# Patient Record
Sex: Female | Born: 1990 | Race: White | Hispanic: Yes | Marital: Single | Smoking: Never smoker
Health system: Southern US, Community
[De-identification: ages and names within clinical notes are randomized; demographics above are authoritative.]

## PROBLEM LIST (undated history)

## (undated) HISTORY — PX: WISDOM TOOTH EXTRACTION: SHX21

---

## 2016-09-19 ENCOUNTER — Encounter: Payer: Self-pay | Admitting: Women's Health

## 2016-09-19 ENCOUNTER — Ambulatory Visit (INDEPENDENT_AMBULATORY_CARE_PROVIDER_SITE_OTHER): Payer: BC Managed Care – PPO | Admitting: Women's Health

## 2016-09-19 VITALS — BP 122/78 | Ht 61.0 in | Wt 123.0 lb

## 2016-09-19 DIAGNOSIS — Z01419 Encounter for gynecological examination (general) (routine) without abnormal findings: Secondary | ICD-10-CM

## 2016-09-19 DIAGNOSIS — Z23 Encounter for immunization: Secondary | ICD-10-CM | POA: Diagnosis not present

## 2016-09-19 DIAGNOSIS — N760 Acute vaginitis: Secondary | ICD-10-CM | POA: Diagnosis not present

## 2016-09-19 DIAGNOSIS — Z3041 Encounter for surveillance of contraceptive pills: Secondary | ICD-10-CM | POA: Diagnosis not present

## 2016-09-19 DIAGNOSIS — Z113 Encounter for screening for infections with a predominantly sexual mode of transmission: Secondary | ICD-10-CM | POA: Diagnosis not present

## 2016-09-19 DIAGNOSIS — B9689 Other specified bacterial agents as the cause of diseases classified elsewhere: Secondary | ICD-10-CM

## 2016-09-19 LAB — CBC WITH DIFFERENTIAL/PLATELET
BASOS ABS: 60 {cells}/uL (ref 0–200)
Basophils Relative: 1 %
EOS PCT: 1 %
Eosinophils Absolute: 60 cells/uL (ref 15–500)
HEMATOCRIT: 38.8 % (ref 35.0–45.0)
HEMOGLOBIN: 12.4 g/dL (ref 11.7–15.5)
LYMPHS ABS: 1620 {cells}/uL (ref 850–3900)
LYMPHS PCT: 27 %
MCH: 30.3 pg (ref 27.0–33.0)
MCHC: 32 g/dL (ref 32.0–36.0)
MCV: 94.9 fL (ref 80.0–100.0)
MONO ABS: 420 {cells}/uL (ref 200–950)
MPV: 9 fL (ref 7.5–12.5)
Monocytes Relative: 7 %
NEUTROS PCT: 64 %
Neutro Abs: 3840 cells/uL (ref 1500–7800)
Platelets: 270 10*3/uL (ref 140–400)
RBC: 4.09 MIL/uL (ref 3.80–5.10)
RDW: 14.1 % (ref 11.0–15.0)
WBC: 6 10*3/uL (ref 3.8–10.8)

## 2016-09-19 LAB — WET PREP FOR TRICH, YEAST, CLUE
Trich, Wet Prep: NONE SEEN
Yeast Wet Prep HPF POC: NONE SEEN

## 2016-09-19 MED ORDER — METRONIDAZOLE 500 MG PO TABS: 500.0000 mg | ORAL_TABLET | Freq: Two times a day (BID) | ORAL | 0 refills | Status: DC

## 2016-09-19 MED ORDER — NORETHIN ACE-ETH ESTRAD-FE 1-20 MG-MCG(24) PO TABS: ORAL_TABLET | ORAL | 4 refills | Status: DC

## 2016-09-19 NOTE — Progress Notes (Signed)
Lourdes SledgeKatherine Griffy Feb 01, 1991 [id number]    History:   26 y/o Hispanic/Caucasian female presents for annual exam (new patient). She reports regular cycles lasting 5 days with mild premenstral syndrome prior to start of menses, currently on Loestrin. She denies dysmenorrhea and menorrhagia. Sexually active with 1 new partner . Reports last pap normal greater than 4 years ago. Has completed 2 of 3 shots for Gardisil.       Past medical history, past surgical history, family history and social history were all reviewed and documented in the EPIC chart. 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  ROS:  A ROS was performed and pertinent positives and negatives are included.  Exam:  Vitals:   09/19/16 1606  BP: 122/78  Weight: 123 lb (55.8 kg)  Height: 5\' 1"  (1.549 m)   Body mass index is 23.24 kg/m.   General appearance:  Normal Thyroid:  Symmetrical, normal in size, without palpable masses or nodularity. Respiratory  Auscultation:  Clear without wheezing or rhonchi Cardiovascular  Auscultation:  Regular rate, without rubs, murmurs or gallops  Edema/varicosities:  Not grossly evident Abdominal  Soft,nontender, without masses, guarding or rebound.  Liver/spleen:  No organomegaly noted  Hernia:  None appreciated  Skin  Inspection:  Grossly normal   Breasts: Examined lying and sitting.     Right: Without masses, retractions, discharge or axillary adenopathy.     Left: Without masses, retractions, discharge or axillary adenopathy. Gentitourinary   Inguinal/mons:  Normal without inguinal adenopathy  External genitalia:  Normal  BUS/Urethra/Skene's glands:  Normal  Vagina:  Normal  Cervix:  Normal  Uterus:  normal in size, shape and contour.  Midline and mobile  Adnexa/parametria:     Rt: Without masses or tenderness.   Lt: Without masses or tenderness.  Anus and perineum: Normal   Assessment/Plan:  26 y.o. SHF G0 new patient annual exam with no complaints.  Monthly  cycle on Loestrin Bacterial vaginosis STD screen  Plan: Loestrin 1/20 prescription, proper use, slight risk for blood clots and strokes reviewed. Condoms encouraged until permanent partner. SBE's, exercise, calcium rich diet, MVI daily encouraged. Third Gardasil given reviewed completed series. Flagyl 500 twice daily for 7 days, alcohol precautions reviewed. CBC, GC/Chlamydia, HIV, hep B, C, RPR. Pap.   Harrington Challengerancy J Young [facility]NP, 5:03 PM 09/19/2016

## 2016-09-19 NOTE — Patient Instructions (Signed)
Bacterial Vaginosis Bacterial vaginosis is an infection of the vagina. It happens when too many germs (bacteria) grow in the vagina. This infection puts you at risk for infections from sex (STIs). Treating this infection can lower your risk for some STIs. You should also treat this if you are pregnant. It can cause your baby to be born early. Follow these instructions at home: Medicines  Take over-the-counter and prescription medicines only as told by your doctor.  Take or use your antibiotic medicine as told by your doctor. Do not stop taking or using it even if you start to feel better. General instructions  If you your sexual partner is a woman, tell her that you have this infection. She needs to get treatment if she has symptoms. If you have a female partner, he does not need to be treated.  During treatment: ? Avoid sex. ? Do not douche. ? Avoid alcohol as told. ? Avoid breastfeeding as told.  Drink enough fluid to keep your pee (urine) clear or pale yellow.  Keep your vagina and butt (rectum) clean. ? Wash the area with warm water every day. ? Wipe from front to back after you use the toilet.  Keep all follow-up visits as told by your doctor. This is important. Preventing this condition  Do not douche.  Use only warm water to wash around your vagina.  Use protection when you have sex. This includes: ? Latex condoms. ? Dental dams.  Limit how many people you have sex with. It is best to only have sex with the same person (be monogamous).  Get tested for STIs. Have your partner get tested.  Wear underwear that is cotton or lined with cotton.  Avoid tight pants and pantyhose. This is most important in summer.  Do not use any products that have nicotine or tobacco in them. These include cigarettes and e-cigarettes. If you need help quitting, ask your doctor.  Do not use illegal drugs.  Limit how much alcohol you drink. Contact a doctor if:  Your symptoms do not get  better, even after you are treated.  You have more discharge or pain when you pee (urinate).  You have a fever.  You have pain in your belly (abdomen).  You have pain with sex.  Your bleed from your vagina between periods. Summary  This infection happens when too many germs (bacteria) grow in the vagina.  Treating this condition can lower your risk for some infections from sex (STIs).  You should also treat this if you are pregnant. It can cause early (premature) birth.  Do not stop taking or using your antibiotic medicine even if you start to feel better. This information is not intended to replace advice given to you by your health care provider. Make sure you discuss any questions you have with your health care provider. Document Released: 01/11/2008 Document Revised: 12/18/2015 Document Reviewed: 12/18/2015 Elsevier Interactive Patient Education  2017 [place] Maintenance, Female Adopting a healthy lifestyle and getting preventive care can go a long way to promote health and wellness. Talk with your health care provider about what schedule of regular examinations is right for you. This is a good chance for you to check in with your provider about disease prevention and staying healthy. In between checkups, there are plenty of things you can do on your own. Experts have done a lot of research about which lifestyle changes and preventive measures are most likely to keep you healthy. Ask your health care  provider for more information. Weight and diet Eat a healthy diet  Be sure to include plenty of vegetables, fruits, low-fat dairy products, and lean protein.  Do not eat a lot of foods high in solid fats, added sugars, or salt.  Get regular exercise. This is one of the most important things you can do for your health. ? Most adults should exercise for at least 150 minutes each week. The exercise should increase your heart rate and make you sweat (moderate-intensity  exercise). ? Most adults should also do strengthening exercises at least twice a week. This is in addition to the moderate-intensity exercise.  Maintain a healthy weight  Body mass index (BMI) is a measurement that can be used to identify possible weight problems. It estimates body fat based on height and weight. Your health care provider can help determine your BMI and help you achieve or maintain a healthy weight.  For females 92 years of age and older: ? A BMI below 18.5 is considered underweight. ? A BMI of 18.5 to 24.9 is normal. ? A BMI of 25 to 29.9 is considered overweight. ? A BMI of 30 and above is considered obese.  Watch levels of cholesterol and blood lipids  You should start having your blood tested for lipids and cholesterol at 25 years of age, then have this test every 5 years.  You may need to have your cholesterol levels checked more often if: ? Your lipid or cholesterol levels are high. ? You are older than 25 years of age. ? You are at high risk for heart disease.  Cancer screening Lung Cancer  Lung cancer screening is recommended for adults 78-69 years old who are at high risk for lung cancer because of a history of smoking.  A yearly low-dose CT scan of the lungs is recommended for people who: ? Currently smoke. ? Have quit within the past 15 years. ? Have at least a 30-pack-year history of smoking. A pack year is smoking an average of one pack of cigarettes a day for 1 year.  Yearly screening should continue until it has been 15 years since you quit.  Yearly screening should stop if you develop a health problem that would prevent you from having lung cancer treatment.  Breast Cancer  Practice breast self-awareness. This means understanding how your breasts normally appear and feel.  It also means doing regular breast self-exams. Let your health care provider know about any changes, no matter how small.  If you are in your 20s or 30s, you should have a  clinical breast exam (CBE) by a health care provider every 1-3 years as part of a regular health exam.  If you are 21 or older, have a CBE every year. Also consider having a breast X-ray (mammogram) every year.  If you have a family history of breast cancer, talk to your health care provider about genetic screening.  If you are at high risk for breast cancer, talk to your health care provider about having an MRI and a mammogram every year.  Breast cancer gene (BRCA) assessment is recommended for women who have family members with BRCA-related cancers. BRCA-related cancers include: ? Breast. ? Ovarian. ? Tubal. ? Peritoneal cancers.  Results of the assessment will determine the need for genetic counseling and BRCA1 and BRCA2 testing.  Cervical Cancer Your health care provider may recommend that you be screened regularly for cancer of the pelvic organs (ovaries, uterus, and vagina). This screening involves a pelvic examination,  including checking for microscopic changes to the surface of your cervix (Pap test). You may be encouraged to have this screening done every 3 years, beginning at age 21.  For women ages 30-65, health care providers may recommend pelvic exams and Pap testing every 3 years, or they may recommend the Pap and pelvic exam, combined with testing for human papilloma virus (HPV), every 5 years. Some types of HPV increase your risk of cervical cancer. Testing for HPV may also be done on women of any age with unclear Pap test results.  Other health care providers may not recommend any screening for nonpregnant women who are considered low risk for pelvic cancer and who do not have symptoms. Ask your health care provider if a screening pelvic exam is right for you.  If you have had past treatment for cervical cancer or a condition that could lead to cancer, you need Pap tests and screening for cancer for at least 20 years after your treatment. If Pap tests have been discontinued,  your risk factors (such as having a new sexual partner) need to be reassessed to determine if screening should resume. Some women have medical problems that increase the chance of getting cervical cancer. In these cases, your health care provider may recommend more frequent screening and Pap tests.  Colorectal Cancer  This type of cancer can be detected and often prevented.  Routine colorectal cancer screening usually begins at 25 years of age and continues through 25 years of age.  Your health care provider may recommend screening at an earlier age if you have risk factors for colon cancer.  Your health care provider may also recommend using home test kits to check for hidden blood in the stool.  A small camera at the end of a tube can be used to examine your colon directly (sigmoidoscopy or colonoscopy). This is done to check for the earliest forms of colorectal cancer.  Routine screening usually begins at age 50.  Direct examination of the colon should be repeated every 5-10 years through 25 years of age. However, you may need to be screened more often if early forms of precancerous polyps or small growths are found.  Skin Cancer  Check your skin from head to toe regularly.  Tell your health care provider about any new moles or changes in moles, especially if there is a change in a mole's shape or color.  Also tell your health care provider if you have a mole that is larger than the size of a pencil eraser.  Always use sunscreen. Apply sunscreen liberally and repeatedly throughout the day.  Protect yourself by wearing long sleeves, pants, a wide-brimmed hat, and sunglasses whenever you are outside.  Heart disease, diabetes, and high blood pressure  High blood pressure causes heart disease and increases the risk of stroke. High blood pressure is more likely to develop in: ? People who have blood pressure in the high end of the normal range (130-139/85-89 mm Hg). ? People who are  overweight or obese. ? People who are African American.  If you are 18-39 years of age, have your blood pressure checked every 3-5 years. If you are 40 years of age or older, have your blood pressure checked every year. You should have your blood pressure measured twice-once when you are at a hospital or clinic, and once when you are not at a hospital or clinic. Record the average of the two measurements. To check your blood pressure when you are not   at a hospital or clinic, you can use: ? An automated blood pressure machine at a pharmacy. ? A home blood pressure monitor.  If you are between 59 years and 66 years old, ask your health care provider if you should take aspirin to prevent strokes.  Have regular diabetes screenings. This involves taking a blood sample to check your fasting blood sugar level. ? If you are at a normal weight and have a low risk for diabetes, have this test once every three years after 26 years of age. ? If you are overweight and have a high risk for diabetes, consider being tested at a younger age or more often. Preventing infection Hepatitis B  If you have a higher risk for hepatitis B, you should be screened for this virus. You are considered at high risk for hepatitis B if: ? You were born in a country where hepatitis B is common. Ask your health care provider which countries are considered high risk. ? Your parents were born in a high-risk country, and you have not been immunized against hepatitis B (hepatitis B vaccine). ? You have HIV or AIDS. ? You use needles to inject street drugs. ? You live with someone who has hepatitis B. ? You have had sex with someone who has hepatitis B. ? You get hemodialysis treatment. ? You take certain medicines for conditions, including cancer, organ transplantation, and autoimmune conditions.  Hepatitis C  Blood testing is recommended for: ? Everyone born from [birth year] through 1964. ? Anyone with known risk factors for  hepatitis C.  Sexually transmitted infections (STIs)  You should be screened for sexually transmitted infections (STIs) including gonorrhea and chlamydia if: ? You are sexually active and are younger than 25 years of age. ? You are older than 25 years of age and your health care provider tells you that you are at risk for this type of infection. ? Your sexual activity has changed since you were last screened and you are at an increased risk for chlamydia or gonorrhea. Ask your health care provider if you are at risk.  If you do not have HIV, but are at risk, it may be recommended that you take a prescription medicine daily to prevent HIV infection. This is called pre-exposure prophylaxis (PrEP). You are considered at risk if: ? You are sexually active and do not regularly use condoms or know the HIV status of your partner(s). ? You take drugs by injection. ? You are sexually active with a partner who has HIV.  Talk with your health care provider about whether you are at high risk of being infected with HIV. If you choose to begin PrEP, you should first be tested for HIV. You should then be tested every 3 months for as long as you are taking PrEP. Pregnancy  If you are premenopausal and you may become pregnant, ask your health care provider about preconception counseling.  If you may become pregnant, take 400 to 800 micrograms (mcg) of folic acid every day.  If you want to prevent pregnancy, talk to your health care provider about birth control (contraception). Osteoporosis and menopause  Osteoporosis is a disease in which the bones lose minerals and strength with aging. This can result in serious bone fractures. Your risk for osteoporosis can be identified using a bone density scan.  If you are 57 years of age or older, or if you are at risk for osteoporosis and fractures, ask your health care provider if you should be  screened.  Ask your health care provider whether you should take a  calcium or vitamin D supplement to lower your risk for osteoporosis.  Menopause may have certain physical symptoms and risks.  Hormone replacement therapy may reduce some of these symptoms and risks. Talk to your health care provider about whether hormone replacement therapy is right for you. Follow these instructions at home:  Schedule regular health, dental, and eye exams.  Stay current with your immunizations.  Do not use any tobacco products including cigarettes, chewing tobacco, or electronic cigarettes.  If you are pregnant, do not drink alcohol.  If you are breastfeeding, limit how much and how often you drink alcohol.  Limit alcohol intake to no more than 1 drink per day for nonpregnant women. One drink equals 12 ounces of beer, 5 ounces of wine, or 1 ounces of hard liquor.  Do not use street drugs.  Do not share needles.  Ask your health care provider for help if you need support or information about quitting drugs.  Tell your health care provider if you often feel depressed.  Tell your health care provider if you have ever been abused or do not feel safe at home. This information is not intended to replace advice given to you by your health care provider. Make sure you discuss any questions you have with your health care provider. Document Released: 10/17/2010 Document Revised: 09/09/2015 Document Reviewed: 01/05/2015 Elsevier Interactive Patient Education  2018 [employer]  

## 2016-09-20 LAB — URINALYSIS W MICROSCOPIC + REFLEX CULTURE
Bacteria, UA: NONE SEEN [HPF]
Bilirubin Urine: NEGATIVE
CASTS: NONE SEEN [LPF]
Crystals: NONE SEEN [HPF]
Glucose, UA: NEGATIVE
Hgb urine dipstick: NEGATIVE
KETONES UR: NEGATIVE
Leukocytes, UA: NEGATIVE
NITRITE: NEGATIVE
PH: 6 (ref 5.0–8.0)
Protein, ur: NEGATIVE
RBC / HPF: NONE SEEN RBC/HPF (ref <=2)
SPECIFIC GRAVITY, URINE: 1.013 (ref 1.001–1.035)
Yeast: NONE SEEN [HPF]

## 2016-09-20 LAB — HEPATITIS C ANTIBODY: HCV AB: NEGATIVE

## 2016-09-20 LAB — GC/CHLAMYDIA PROBE AMP
CT Probe RNA: NOT DETECTED
GC PROBE AMP APTIMA: NOT DETECTED

## 2016-09-20 LAB — RPR

## 2016-09-20 LAB — HEPATITIS B SURFACE ANTIGEN: HEP B S AG: NEGATIVE

## 2016-09-20 LAB — HIV ANTIBODY (ROUTINE TESTING W REFLEX): HIV 1&2 Ab, 4th Generation: NONREACTIVE

## 2016-09-20 NOTE — Addendum Note (Signed)
Addended by: Kem ParkinsonBARNES, Annalia Metzger on: 09/20/2016 08:26 AM   Modules accepted: Orders

## 2016-09-21 LAB — PAP IG W/ RFLX HPV ASCU

## 2016-09-21 LAB — URINE CULTURE

## 2016-09-22 LAB — HUMAN PAPILLOMAVIRUS, HIGH RISK: HPV DNA High Risk: NOT DETECTED

## 2016-12-27 ENCOUNTER — Ambulatory Visit
Admission: RE | Admit: 2016-12-27 | Discharge: 2016-12-27 | Disposition: A | Discharge status: Home / Self Care | Payer: BC Managed Care – PPO | Source: Ambulatory Visit | Attending: Family Medicine | Admitting: Family Medicine

## 2016-12-27 ENCOUNTER — Other Ambulatory Visit: Payer: Self-pay | Admitting: Family Medicine

## 2016-12-27 DIAGNOSIS — Q799 Congenital malformation of musculoskeletal system, unspecified: Secondary | ICD-10-CM

## 2017-09-24 ENCOUNTER — Ambulatory Visit: Payer: BC Managed Care – PPO | Admitting: Women's Health

## 2017-09-24 ENCOUNTER — Encounter: Payer: Self-pay | Admitting: Women's Health

## 2017-09-24 VITALS — BP 110/78 | Ht 61.0 in | Wt 126.0 lb

## 2017-09-24 DIAGNOSIS — Z3041 Encounter for surveillance of contraceptive pills: Secondary | ICD-10-CM | POA: Diagnosis not present

## 2017-09-24 DIAGNOSIS — Z01419 Encounter for gynecological examination (general) (routine) without abnormal findings: Secondary | ICD-10-CM

## 2017-09-24 LAB — CBC WITH DIFFERENTIAL/PLATELET
BASOS PCT: 0.4 %
Basophils Absolute: 27 cells/uL (ref 0–200)
EOS ABS: 41 {cells}/uL (ref 15–500)
EOS PCT: 0.6 %
HCT: 35.3 % (ref 35.0–45.0)
Hemoglobin: 11.9 g/dL (ref 11.7–15.5)
Lymphs Abs: 1884 cells/uL (ref 850–3900)
MCH: 31.6 pg (ref 27.0–33.0)
MCHC: 33.7 g/dL (ref 32.0–36.0)
MCV: 93.6 fL (ref 80.0–100.0)
MONOS PCT: 6.7 %
MPV: 9.2 fL (ref 7.5–12.5)
NEUTROS ABS: 4393 {cells}/uL (ref 1500–7800)
Neutrophils Relative %: 64.6 %
PLATELETS: 234 10*3/uL (ref 140–400)
RBC: 3.77 10*6/uL — ABNORMAL LOW (ref 3.80–5.10)
RDW: 13 % (ref 11.0–15.0)
TOTAL LYMPHOCYTE: 27.7 %
WBC mixed population: 456 cells/uL (ref 200–950)
WBC: 6.8 10*3/uL (ref 3.8–10.8)

## 2017-09-24 MED ORDER — NORETHIN ACE-ETH ESTRAD-FE 1-20 MG-MCG(24) PO TABS: ORAL_TABLET | ORAL | 4 refills | Status: DC

## 2017-09-24 NOTE — Progress Notes (Signed)
Alicia Koch 1990-11-17 [id number]    History:    Presents for annual exam.  Regular monthly cycle on Loestrin without complaint.  Same partner.  Normal Pap history.  Gardasil series completed.  Past medical history, past surgical history, family history and social history were all reviewed and documented in the EPIC chart.  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  Mother questionable history of polycystic ovary disease.  Father deceased from an accident at age 26.  ROS:  A ROS was performed and pertinent positives and negatives are included.  Exam:  Vitals:   09/24/17 1601  BP: 110/78  Weight: 126 lb (57.2 kg)  Height: 5\' 1"  (1.549 m)   Body mass index is 23.81 kg/m.   General appearance:  Normal Thyroid:  Symmetrical, normal in size, without palpable masses or nodularity. Respiratory  Auscultation:  Clear without wheezing or rhonchi Cardiovascular  Auscultation:  Regular rate, without rubs, murmurs or gallops  Edema/varicosities:  Not grossly evident Abdominal  Soft,nontender, without masses, guarding or rebound.  Liver/spleen:  No organomegaly noted  Hernia:  None appreciated  Skin  Inspection:  Grossly normal   Breasts: Examined lying and sitting.     Right: Without masses, retractions, discharge or axillary adenopathy.     Left: Without masses, retractions, discharge or axillary adenopathy. Gentitourinary   Inguinal/mons:  Normal without inguinal adenopathy  External genitalia:  Normal  BUS/Urethra/Skene's glands:  Normal  Vagina:  Normal  Cervix:  Normal  Uterus:   normal in size, shape and contour.  Midline and mobile  Adnexa/parametria:     Rt: Without masses or tenderness.   Lt: Without masses or tenderness.  Anus and perineum: Normal  Digital rectal exam: Normal sphincter tone without palpated masses or tenderness  Assessment/Plan:  27 y.o. SHF G0 for annual exam with no complaints.  Monthly cycle on Loestrin  Plan:  Loestrin 1/20 prescription, proper use, slight risk for blood clots and strokes reviewed.  SBE's, exercise, calcium rich foods, MVI daily encouraged.  CBC.  Pap normal 2018, new screening guidelines reviewed.   Harrington Challengerancy J Charlyn Vialpando [facility], 6:23 PM 09/24/2017

## 2017-09-24 NOTE — Patient Instructions (Signed)

## 2018-07-29 IMAGING — DX DG RIBS W/ CHEST 3+V BILAT
5 series · 5 of 5 positions shown · non-contrast
Comparison: None.

CLINICAL DATA: Bony abnormality.

EXAM:
BILATERAL RIBS AND CHEST - 4+ VIEW

[dg ribs bilateral w/chest (1 of 5)]
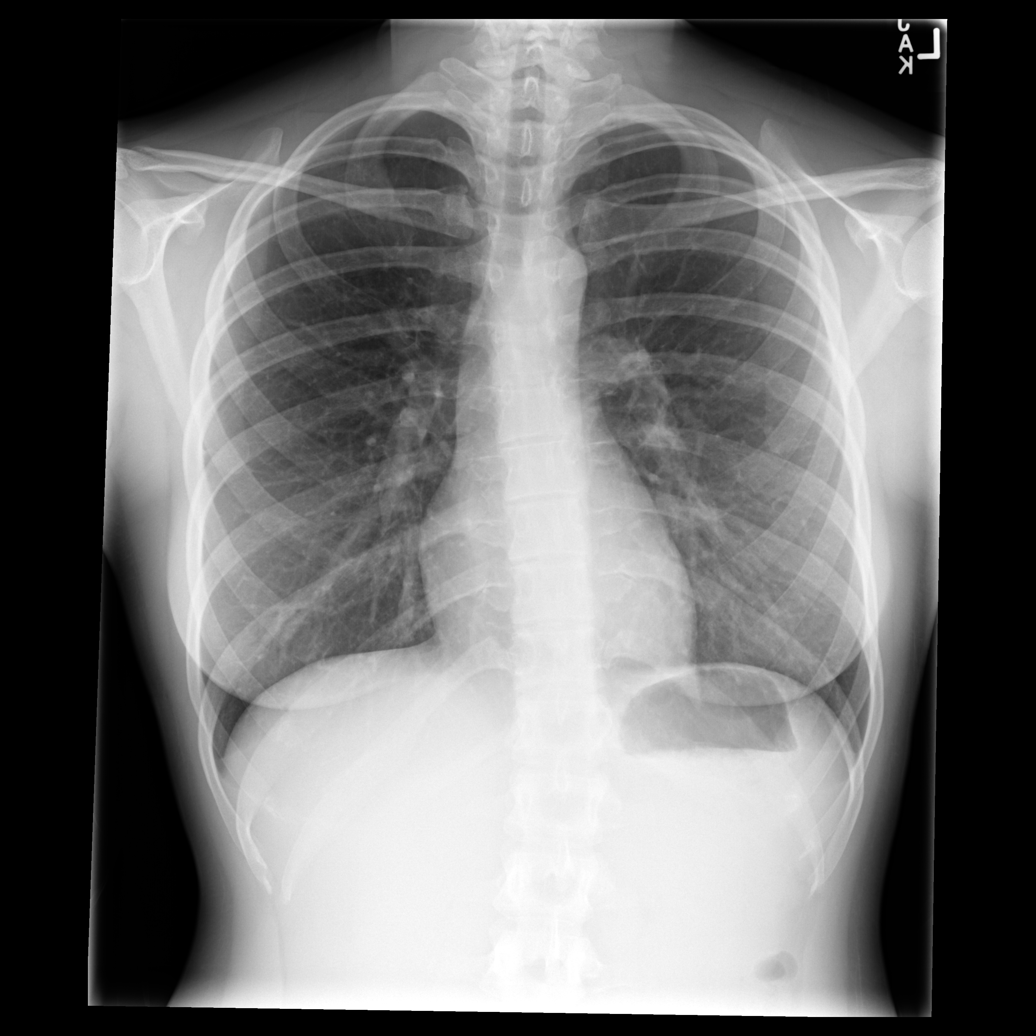

[dg ribs bilateral w/chest (2 of 5)]
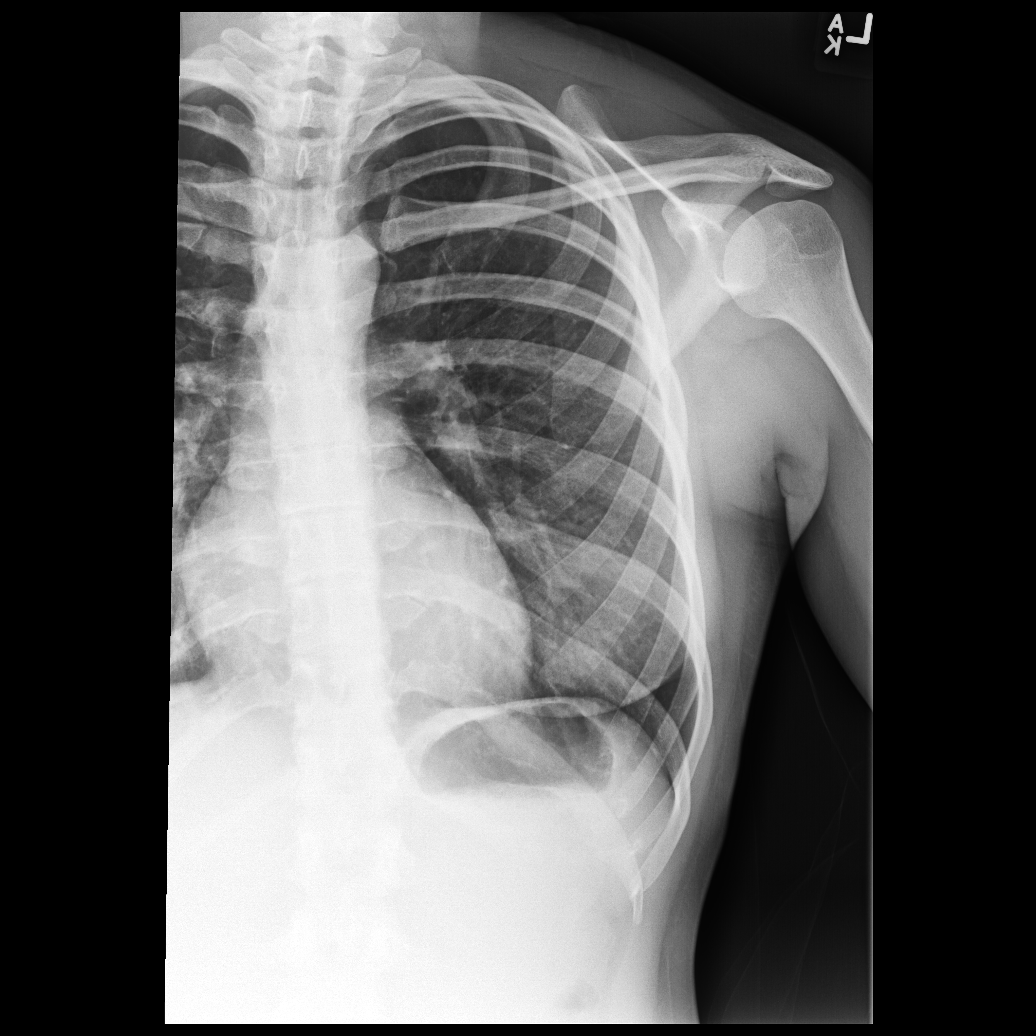

[dg ribs bilateral w/chest (3 of 5)]
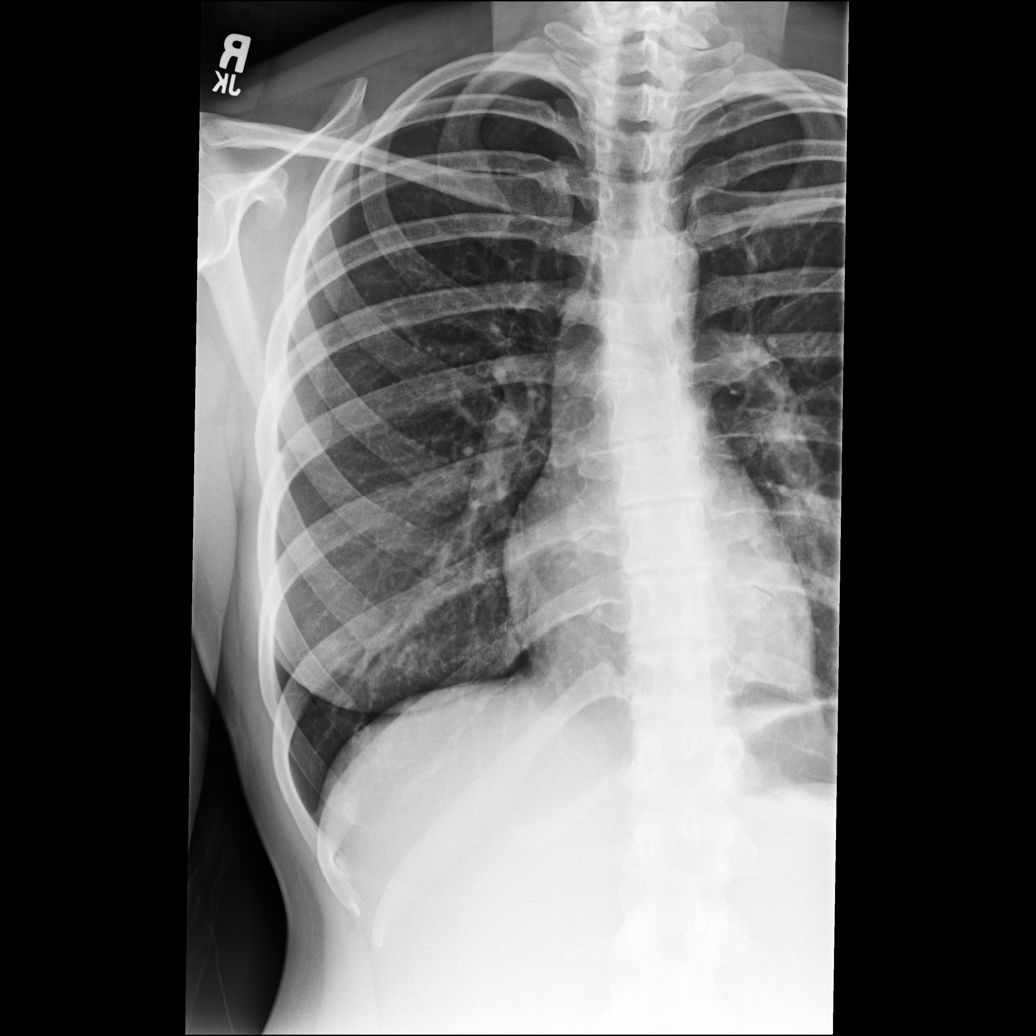

[dg ribs bilateral w/chest (4 of 5)]
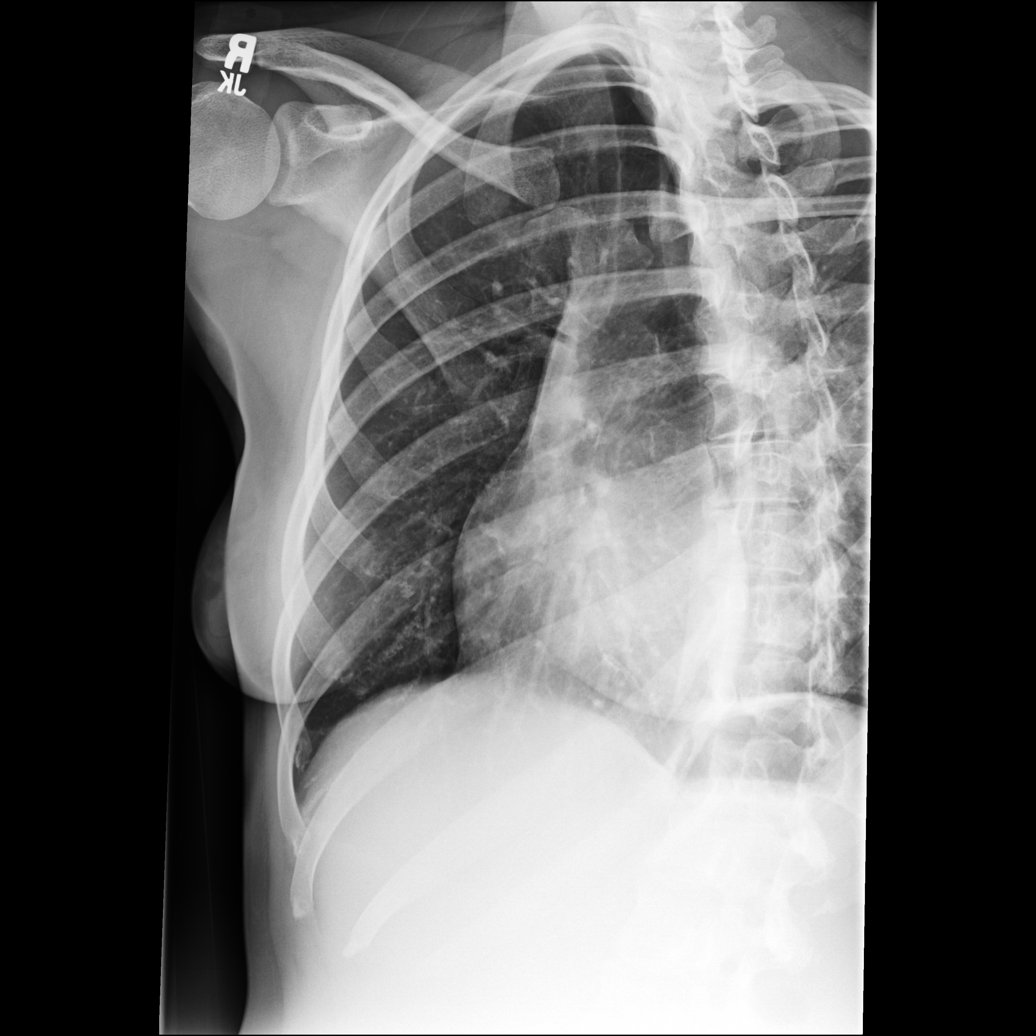

[dg ribs bilateral w/chest (5 of 5)]
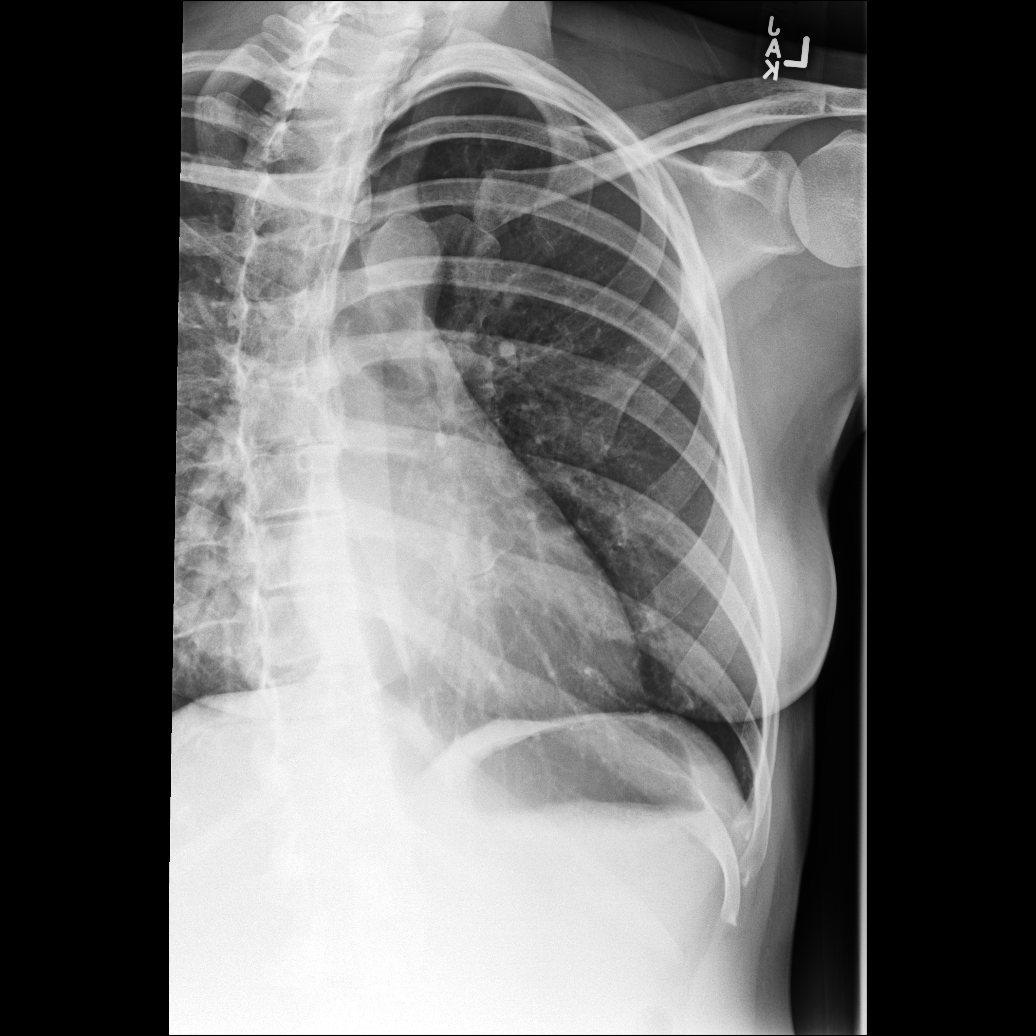

[5 of 5 positions shown; findings below may reference images not displayed]

FINDINGS: No fracture or other bone lesions are seen involving the ribs. There
is no evidence of pneumothorax or pleural effusion. Both lungs are
clear. Heart size and mediastinal contours are within normal limits.
IMPRESSION: Normal bilateral ribs.  No acute cardiopulmonary abnormality seen.

## 2018-09-30 ENCOUNTER — Encounter: Payer: Self-pay | Admitting: Women's Health

## 2018-09-30 ENCOUNTER — Other Ambulatory Visit: Payer: Self-pay

## 2018-09-30 ENCOUNTER — Ambulatory Visit: Payer: BC Managed Care – PPO | Admitting: Women's Health

## 2018-09-30 VITALS — BP 114/70 | Ht 61.0 in | Wt 133.0 lb

## 2018-09-30 DIAGNOSIS — Z3041 Encounter for surveillance of contraceptive pills: Secondary | ICD-10-CM | POA: Diagnosis not present

## 2018-09-30 DIAGNOSIS — Z01419 Encounter for gynecological examination (general) (routine) without abnormal findings: Secondary | ICD-10-CM

## 2018-09-30 MED ORDER — NORETHIN ACE-ETH ESTRAD-FE 1-20 MG-MCG(24) PO TABS: ORAL_TABLET | ORAL | 4 refills | Status: AC

## 2018-09-30 NOTE — Patient Instructions (Signed)

## 2018-09-30 NOTE — Progress Notes (Signed)
Alicia Koch 1990/05/24 [id number]    History:    Presents for annual exam.  Monthly light cycle on Loestrin without complaint.  Same partner.  Normal Pap history.  Gardasil series completed.  Past medical history, past surgical history, family history and social history were all reviewed and documented in the EPIC chart.  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  Father died from an accident years ago.  ROS:  A ROS was performed and pertinent positives and negatives are included.  Exam:  Vitals:   09/30/18 1608  BP: 114/70  Weight: 133 lb (60.3 kg)  Height: 5\' 1"  (1.549 m)   Body mass index is 25.13 kg/m.   General appearance:  Normal Thyroid:  Symmetrical, normal in size, without palpable masses or nodularity. Respiratory  Auscultation:  Clear without wheezing or rhonchi Cardiovascular  Auscultation:  Regular rate, without rubs, murmurs or gallops  Edema/varicosities:  Not grossly evident Abdominal  Soft,nontender, without masses, guarding or rebound.  Liver/spleen:  No organomegaly noted  Hernia:  None appreciated  Skin  Inspection:  Grossly normal   Breasts: Examined lying and sitting.     Right: Without masses, retractions, discharge or axillary adenopathy.     Left: Without masses, retractions, discharge or axillary adenopathy. Gentitourinary   Inguinal/mons:  Normal without inguinal adenopathy  External genitalia:  Normal  BUS/Urethra/Skene's glands:  Normal  Vagina:  Normal  Cervix:  Normal  Uterus:   normal in size, shape and contour.  Midline and mobile  Adnexa/parametria:     Rt: Without masses or tenderness.   Lt: Without masses or tenderness.  Anus and perineum: Normal    Assessment/Plan:  28 y.o. S WF G0 for annual exam with no complaints.  Monthly cycle on Loestrin  Plan: Loestrin 1/20 prescription, proper use, slight risk for blood clots and strokes reviewed.  SBEs, regular exercise,  calcium rich foods, MVI daily encouraged.  CBC, Pap normal 2018, new screening guidelines reviewed.    [place], 5:15 PM 09/30/2018

## 2019-10-01 ENCOUNTER — Encounter: Payer: BC Managed Care – PPO | Admitting: Nurse Practitioner

## 2019-10-01 DIAGNOSIS — Z0289 Encounter for other administrative examinations: Secondary | ICD-10-CM
